# Patient Record
Sex: Female | Born: 1980 | Race: Black or African American | Hispanic: No | Marital: Single | Smoking: Current every day smoker
Health system: Southern US, Community
[De-identification: ages and names within clinical notes are randomized; demographics above are authoritative.]

---

## 2021-06-14 ENCOUNTER — Emergency Department (HOSPITAL_BASED_OUTPATIENT_CLINIC_OR_DEPARTMENT_OTHER)
Admission: EM | Admit: 2021-06-14 | Discharge: 2021-06-14 | Disposition: A | Discharge status: Home / Self Care | Payer: Medicaid Other | Attending: Emergency Medicine | Admitting: Emergency Medicine

## 2021-06-14 ENCOUNTER — Other Ambulatory Visit: Payer: Self-pay

## 2021-06-14 ENCOUNTER — Encounter (HOSPITAL_BASED_OUTPATIENT_CLINIC_OR_DEPARTMENT_OTHER): Payer: Self-pay | Admitting: Urology

## 2021-06-14 ENCOUNTER — Emergency Department (HOSPITAL_BASED_OUTPATIENT_CLINIC_OR_DEPARTMENT_OTHER): Payer: Medicaid Other

## 2021-06-14 DIAGNOSIS — R1084 Generalized abdominal pain: Secondary | ICD-10-CM | POA: Diagnosis present

## 2021-06-14 DIAGNOSIS — F1010 Alcohol abuse, uncomplicated: Secondary | ICD-10-CM | POA: Diagnosis not present

## 2021-06-14 DIAGNOSIS — R1031 Right lower quadrant pain: Secondary | ICD-10-CM | POA: Insufficient documentation

## 2021-06-14 DIAGNOSIS — D72829 Elevated white blood cell count, unspecified: Secondary | ICD-10-CM | POA: Insufficient documentation

## 2021-06-14 DIAGNOSIS — R1033 Periumbilical pain: Secondary | ICD-10-CM | POA: Insufficient documentation

## 2021-06-14 LAB — URINALYSIS, ROUTINE W REFLEX MICROSCOPIC
Bilirubin Urine: NEGATIVE
Glucose, UA: NEGATIVE mg/dL
Ketones, ur: NEGATIVE mg/dL
Leukocytes,Ua: NEGATIVE
Nitrite: NEGATIVE
Protein, ur: NEGATIVE mg/dL
Specific Gravity, Urine: 1.025 (ref 1.005–1.030)
pH: 6.5 (ref 5.0–8.0)

## 2021-06-14 LAB — COMPREHENSIVE METABOLIC PANEL
ALT: 21 U/L (ref 0–44)
AST: 18 U/L (ref 15–41)
Albumin: 3.8 g/dL (ref 3.5–5.0)
Alkaline Phosphatase: 67 U/L (ref 38–126)
Anion gap: 6 (ref 5–15)
BUN: 14 mg/dL (ref 6–20)
CO2: 23 mmol/L (ref 22–32)
Calcium: 9.2 mg/dL (ref 8.9–10.3)
Chloride: 109 mmol/L (ref 98–111)
Creatinine, Ser: 0.93 mg/dL (ref 0.44–1.00)
GFR, Estimated: >60 mL/min (ref >60)
Glucose, Bld: 99 mg/dL (ref 70–99)
Potassium: 4.1 mmol/L (ref 3.5–5.1)
Sodium: 138 mmol/L (ref 135–145)
Total Bilirubin: 0.4 mg/dL (ref 0.3–1.2)
Total Protein: 7 g/dL (ref 6.5–8.1)

## 2021-06-14 LAB — CBC
HCT: 38.7 % (ref 36.0–46.0)
Hemoglobin: 12.8 g/dL (ref 12.0–15.0)
MCH: 28.2 pg (ref 26.0–34.0)
MCHC: 33.1 g/dL (ref 30.0–36.0)
MCV: 85.2 fL (ref 80.0–100.0)
Platelets: 332 10*3/uL (ref 150–400)
RBC: 4.54 MIL/uL (ref 3.87–5.11)
RDW: 13.7 % (ref 11.5–15.5)
WBC: 11 10*3/uL — ABNORMAL HIGH (ref 4.0–10.5)
nRBC: 0 % (ref 0.0–0.2)

## 2021-06-14 LAB — URINALYSIS, MICROSCOPIC (REFLEX)

## 2021-06-14 LAB — LIPASE, BLOOD: Lipase: 39 U/L (ref 11–51)

## 2021-06-14 LAB — PREGNANCY, URINE: Preg Test, Ur: NEGATIVE

## 2021-06-14 MED ORDER — ONDANSETRON HCL 4 MG/2ML IJ SOLN
4.0000 mg | Freq: Once | INTRAMUSCULAR | Status: AC
  Administered 2021-06-14: 4 mg via INTRAVENOUS
  Filled 2021-06-14: qty 2

## 2021-06-14 MED ORDER — IOHEXOL 300 MG/ML  SOLN
100.0000 mL | Freq: Once | INTRAMUSCULAR | Status: AC | PRN
  Administered 2021-06-14: 100 mL via INTRAVENOUS

## 2021-06-14 MED ORDER — SODIUM CHLORIDE 0.9 % IV BOLUS
1000.0000 mL | Freq: Once | INTRAVENOUS | Status: AC
  Administered 2021-06-14: 1000 mL via INTRAVENOUS

## 2021-06-14 MED ORDER — MORPHINE SULFATE (PF) 4 MG/ML IV SOLN
4.0000 mg | Freq: Once | INTRAVENOUS | Status: AC
  Administered 2021-06-14: 4 mg via INTRAVENOUS
  Filled 2021-06-14: qty 1

## 2021-06-14 MED ORDER — NAPROXEN 500 MG PO TABS: 500.0000 mg | ORAL_TABLET | Freq: Two times a day (BID) | ORAL | 0 refills | Status: AC

## 2021-06-14 NOTE — ED Provider Notes (Signed)
?[facility] EMERGENCY DEPARTMENT ?Provider Note ? ? ?CSN: [id number] ?Arrival date & time: 06/14/21  1746 ? ?  ? ?History ?Chief Complaint  ?Patient presents with  ? Abdominal Pain  ? ? ?Felicia Hood is a 41 y.o. female. ? ?41 year old female with a past medical history of hyperlipidemia presents to the ED with a chief complaint of sudden onset of generalized abdominal pain that began 6 hours prior to arrival.  Patient described as a tightening sensation to the periumbilical area which tends then dissipate but does not radiate anywhere.  She does have a prior history of episodes like these in the past, however never seek medical attention.  There is some alleviating factor when she pushes on her abdomen, no exacerbating factors.  Her last bowel movement was this morning and there was no blood present.  She does endorse alcohol use, however very casually.  He did not take any medication for improvement in her symptoms.  She denies any fever, sick contacts, urinary symptoms, prior surgical intervention of her abdomen. ? ? ?The history is provided by the patient and medical records.  ?Abdominal Pain ?Pain location:  Periumbilical ?Pain quality: pressure and squeezing   ?Pain radiates to:  Does not radiate ?Pain severity:  Moderate ?Onset quality:  Sudden ?Duration:  6 hours ?Timing:  Constant ?Progression:  Worsening ?Chronicity:  New ?Context: not diet changes, not laxative use, not previous surgeries, not recent illness, not sick contacts and not suspicious food intake   ?Relieved by:  Palpation ?Worsened by:  Nothing ?Ineffective treatments:  None tried ?Associated symptoms: no chills, no fever, no hematuria, no shortness of breath and no vaginal discharge   ?Risk factors: alcohol abuse   ?Risk factors: has not had multiple surgeries   ? ?  ? ?Home Medications ?Prior to Admission medications   ?Medication Sig Start Date End Date Taking? Authorizing Provider  ?naproxen (NAPROSYN) 500 MG tablet Take 1  tablet (500 mg total) by mouth 2 (two) times daily with a meal for 7 days. 06/14/21 06/21/21 Yes Claude Manges, PA-C  ?   ? ?Allergies    ?Penicillins   ? ?Review of Systems   ?Review of Systems  ?Constitutional:  Negative for chills and fever.  ?Respiratory:  Negative for shortness of breath.   ?Gastrointestinal:  Positive for abdominal pain.  ?Genitourinary:  Negative for hematuria and vaginal discharge.  ?All other systems reviewed and are negative. ? ?Physical Exam ?Updated Vital Signs ?BP 136/77   Pulse 86   Temp 97.8 ?F (36.6 ?C) (Oral)   Resp 20   Ht 5\' 6"  (1.676 m)   Wt 104.3 kg   SpO2 99%   BMI 37.12 kg/m?  ?Physical Exam ?Vitals and nursing note reviewed.  ?Constitutional:   ?   Appearance: She is well-developed.  ?Cardiovascular:  ?   Rate and Rhythm: Normal rate.  ?Pulmonary:  ?   Effort: Pulmonary effort is normal.  ?   Breath sounds: No wheezing or rales.  ?Abdominal:  ?   General: Abdomen is flat. Bowel sounds are decreased.  ?   Palpations: Abdomen is soft.  ?   Tenderness: There is abdominal tenderness in the periumbilical area. There is no right CVA tenderness or left CVA tenderness. Positive signs include McBurney's sign.  ?   Comments: Bowel sounds are diminished.  Generalized pain with palpation on exam somewhat alleviated with gentle pressure around the periumbilical area, worsening pain around the right lower quadrant.  ?Skin: ?   General:  Skin is warm and dry.  ?Neurological:  ?   Mental Status: She is alert and oriented to person, place, and time.  ? ? ?ED Results / Procedures / Treatments   ?Labs ?(all labs ordered are listed, but only abnormal results are displayed) ?Labs Reviewed  ?CBC - Abnormal; Notable for the following components:  ?    Result Value  ? WBC 11.0 (*)   ? All other components within normal limits  ?URINALYSIS, ROUTINE W REFLEX MICROSCOPIC - Abnormal; Notable for the following components:  ? Hgb urine dipstick TRACE (*)   ? All other components within normal limits   ?URINALYSIS, MICROSCOPIC (REFLEX) - Abnormal; Notable for the following components:  ? Bacteria, UA MANY (*)   ? All other components within normal limits  ?LIPASE, BLOOD  ?COMPREHENSIVE METABOLIC PANEL  ?PREGNANCY, URINE  ? ? ?EKG ?None ? ?Radiology ?CT ABDOMEN PELVIS W CONTRAST ? ?Result Date: 06/14/2021 ?CLINICAL DATA:  Generalized abdominal pain. EXAM: CT ABDOMEN AND PELVIS WITH CONTRAST TECHNIQUE: Multidetector CT imaging of the abdomen and pelvis was performed using the standard protocol following bolus administration of intravenous contrast. RADIATION DOSE REDUCTION: This exam was performed according to the departmental dose-optimization program which includes automated exposure control, adjustment of the mA and/or kV according to patient size and/or use of iterative reconstruction technique. CONTRAST:  OMNIPAQUE IOHEXOL 300 MG/ML  SOLN COMPARISON:  November 22, 2018 FINDINGS: Lower chest: No acute abnormality. Hepatobiliary: No focal liver abnormality is seen. No gallstones, gallbladder wall thickening, or biliary dilatation. Pancreas: Unremarkable. No pancreatic ductal dilatation or surrounding inflammatory changes. Spleen: Normal in size without focal abnormality. Adrenals/Urinary Tract: Adrenal glands are unremarkable. Kidneys are normal in size, without renal calculi or hydronephrosis. 14 mm x 9 mm and 16 mm x 13 mm cysts are seen within the anterior aspect of the mid left kidney. No additional follow-up or imaging is recommended. Bladder is unremarkable. Stomach/Bowel: Stomach is within normal limits. Appendix appears normal. No evidence of bowel wall thickening, distention, or inflammatory changes. Vascular/Lymphatic: No significant vascular findings are present. No enlarged abdominal or pelvic lymph nodes. Reproductive: Uterus is unremarkable. A 4.0 cm x 3.4 cm simple cyst is seen along the posterior aspect of the left adnexa. Other: No abdominal wall hernia or abnormality. No abdominopelvic  ascites. Musculoskeletal: No acute or significant osseous findings. IMPRESSION: 4.0 cm x 3.4 cm simple cyst along the posterior aspect of the left adnexa, likely ovarian in origin. No follow-up imaging is recommended. Reference: JACR 2020 Feb;17(2):248-254 Electronically Signed   By: Aram Candela M.D.   On: 06/14/2021 19:36   ? ?Procedures ?Procedures  ? ? ?Medications Ordered in ED ?Medications  ?morphine (PF) 4 MG/ML injection 4 mg (4 mg Intravenous Given 06/14/21 1824)  ?ondansetron [facility]) injection 4 mg (4 mg Intravenous Given 06/14/21 1823)  ?sodium chloride 0.9 % bolus 1,000 mL ( Intravenous Stopped 06/14/21 1822)  ?iohexol (OMNIPAQUE) 300 MG/ML solution 100 mL (100 mLs Intravenous Contrast Given 06/14/21 1910)  ?morphine (PF) 4 MG/ML injection 4 mg (4 mg Intravenous Given 06/14/21 1947)  ? ? ?ED Course/ Medical Decision Making/ A&P ?  ?                        ?Medical Decision Making ?Amount and/or Complexity of Data Reviewed ?Labs: ordered. ?Radiology: ordered. ? ?Risk ?Prescription drug management. ? ? ? ?This patient presents to the ED for concern of abdominal pain, this involves a number of treatment options,  and is a complaint that carries with it a high risk of complications and morbidity.  The differential diagnosis includes appendicitis, perforation, mesenteric ischemia.  ? ? ?Co morbidities: ?Discussed in HPI ? ? ?Brief History: ? ?Patient with no pertinent past medical history presents to the ED with a chief complaint of sudden onset of abdominal pain approximately 6 hours prior to arrival.  Alleviating with palpation, no exacerbating factors.  Did not take any medication for improvement in symptoms.  No sick contacts, no nausea, no vomiting, no diarrhea.  Last bowel movement approximately this morning without any blood.  No gynecological complaints, no urinary complaints. ? ?EMR reviewed including pt PMHx, past surgical history and past visits to ER.  ? ?See HPI for more details ? ? ?Lab  Tests: ? ?I ordered and independently interpreted labs.  The pertinent results include:   ? ?I personally reviewed all laboratory work and imaging. Metabolic panel without any acute abnormality specifically kidney function

## 2021-06-14 NOTE — Discharge Instructions (Addendum)
Your laboratory results are within normal limits today.  We discussed the CT findings on today's visit, a copy of your report was attached to your discharge papers. ? ?I have also prescribed a short course of anti-inflammatories to help with any pain, please take 1 tablet twice a day with food for the next 7 days. ? ?Please schedule an appointment gastroenterology for further evaluation of your abdominal pain. ?

## 2021-06-14 NOTE — ED Triage Notes (Signed)
Generalized abdominal pain that started this am, denies N/V/D  ?Denies fever  ?

## 2021-06-29 ENCOUNTER — Ambulatory Visit: Payer: Medicaid Other | Admitting: Family Medicine

## 2021-12-04 ENCOUNTER — Encounter (HOSPITAL_BASED_OUTPATIENT_CLINIC_OR_DEPARTMENT_OTHER): Payer: Self-pay | Admitting: Emergency Medicine

## 2021-12-04 ENCOUNTER — Other Ambulatory Visit: Payer: Self-pay

## 2021-12-04 ENCOUNTER — Emergency Department (HOSPITAL_BASED_OUTPATIENT_CLINIC_OR_DEPARTMENT_OTHER)
Admission: EM | Admit: 2021-12-04 | Discharge: 2021-12-04 | Discharge status: Against Medical Advice | Payer: Medicaid Other | Attending: Emergency Medicine | Admitting: Emergency Medicine

## 2021-12-04 DIAGNOSIS — M5441 Lumbago with sciatica, right side: Secondary | ICD-10-CM | POA: Insufficient documentation

## 2021-12-04 DIAGNOSIS — Z5321 Procedure and treatment not carried out due to patient leaving prior to being seen by health care provider: Secondary | ICD-10-CM | POA: Insufficient documentation

## 2021-12-04 NOTE — ED Triage Notes (Signed)
Pt c/o right sided back pain associated with sciatica. Hx of same. States feels the same.

## 2023-09-29 IMAGING — CT CT ABD-PELV W/ CM
2 of 5 series · 15 of 46 positions shown, 17 images · IV contrast (Omnipaque)
Comparison: November 22, 2018

CLINICAL DATA: Generalized abdominal pain.

EXAM:
CT ABDOMEN AND PELVIS WITH CONTRAST
TECHNIQUE: Multidetector CT imaging of the abdomen and pelvis was performed
using the standard protocol following bolus administration of
intravenous contrast.

[Series 2: axial st · axial · 0.98mm/px · z∈[+670,+1040]mm · 12 of 84 slices shown, 14 images]
[im 5/84  soft-tissue]
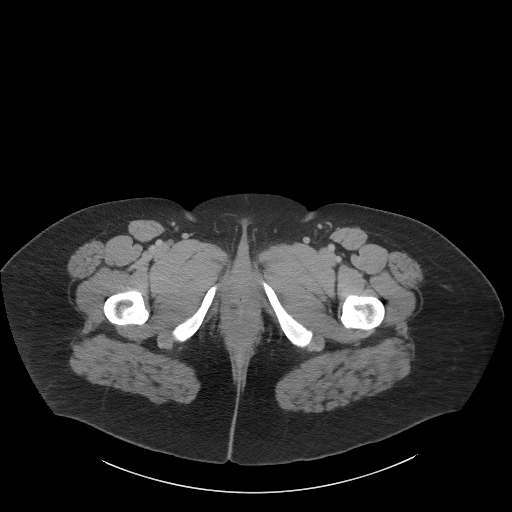
[im 5/84  bone]
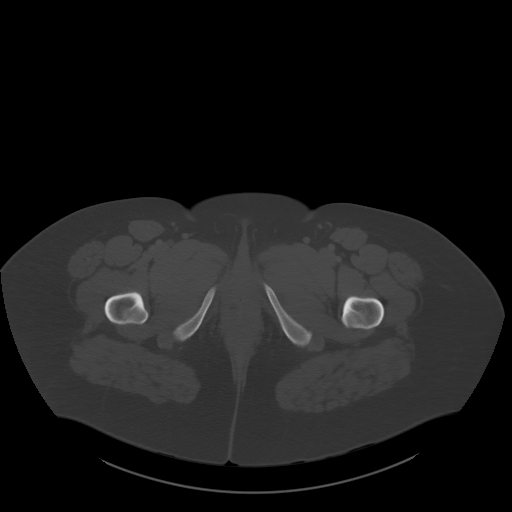
[im 14/84  soft-tissue]
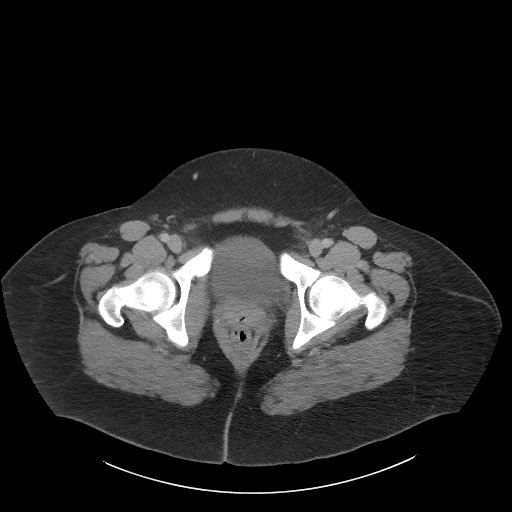
[im 18/84  soft-tissue]
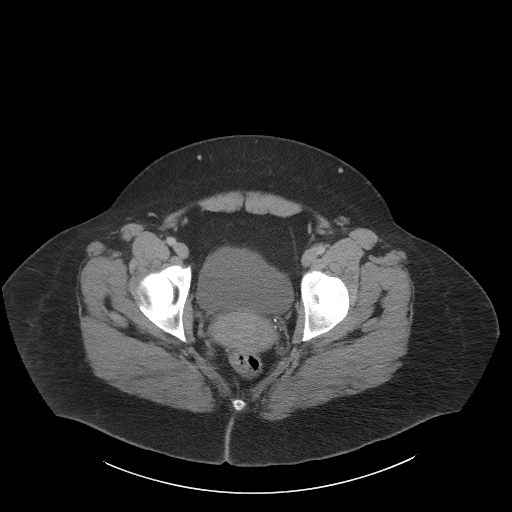
[im 27/84  soft-tissue]
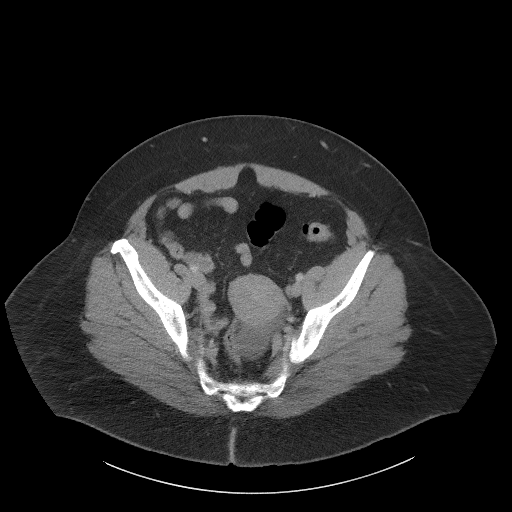
[im 31/84  soft-tissue]
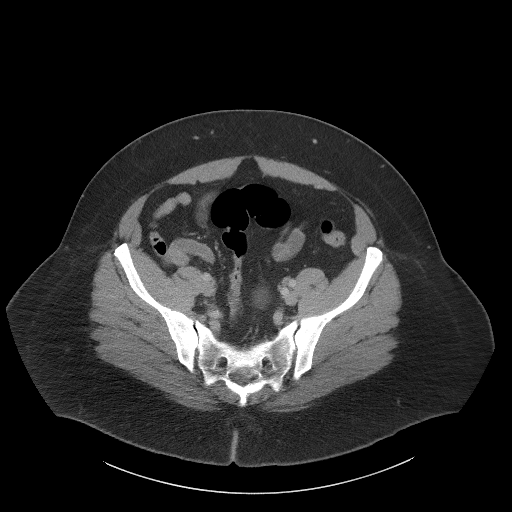
[im 40/84  soft-tissue]
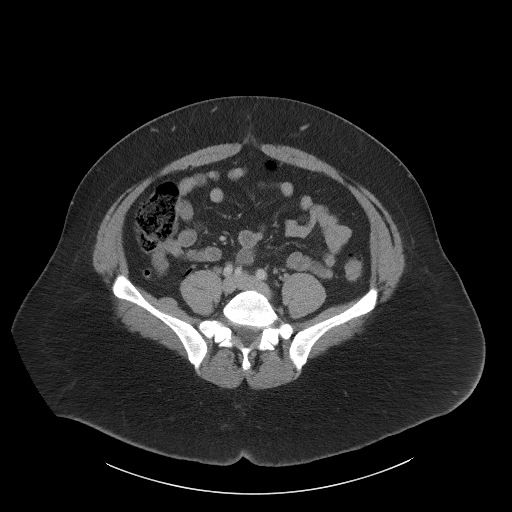
[im 44/84  soft-tissue]
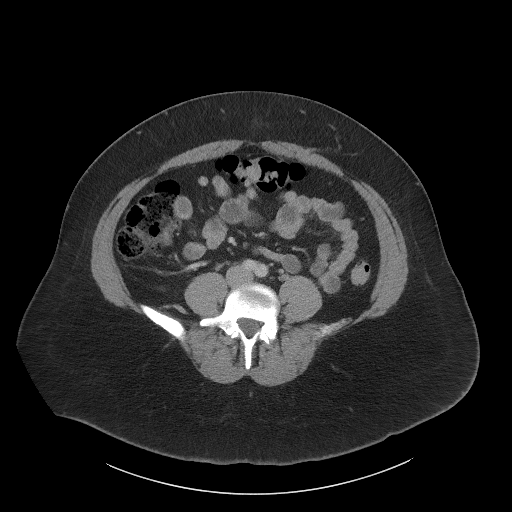
[im 53/84  soft-tissue]
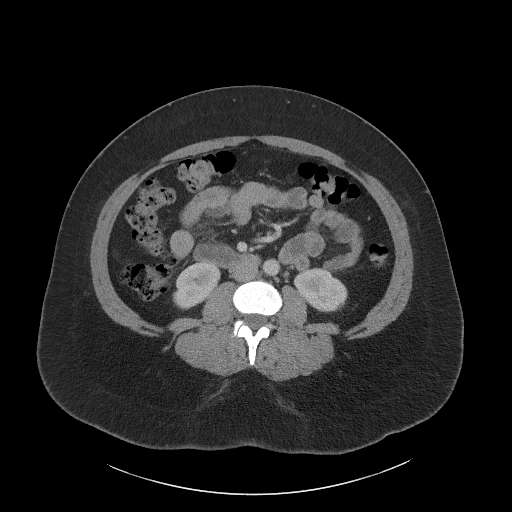
[im 57/84  soft-tissue]
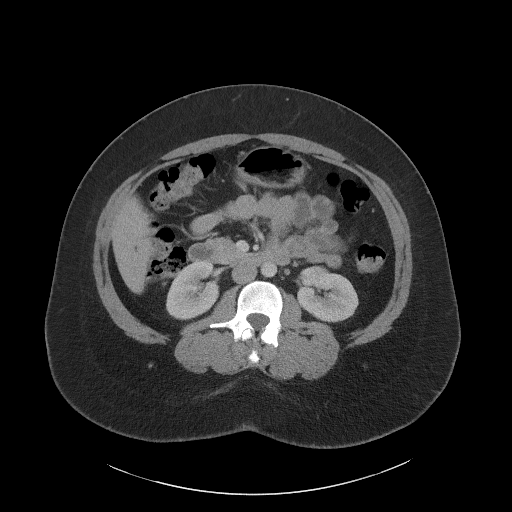
[im 57/84  bone]
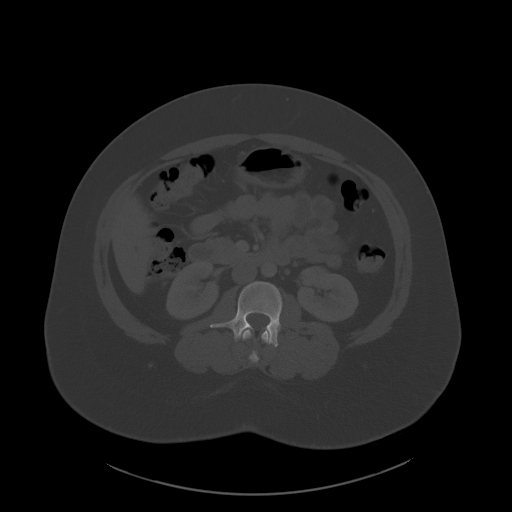
[im 66/84  soft-tissue]
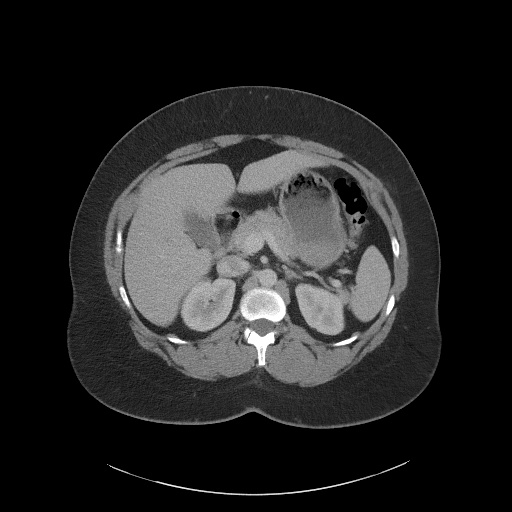
[im 70/84  soft-tissue]
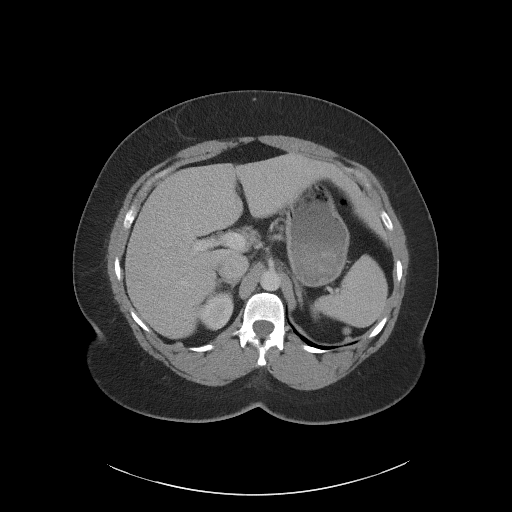
[im 79/84  soft-tissue]
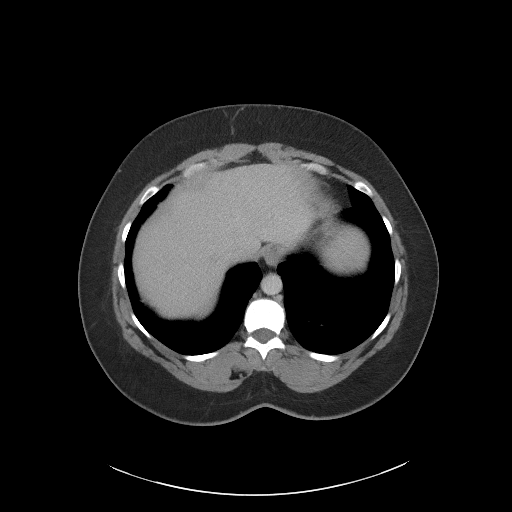

[Series 5: coronal st · coronal · 0.70mm/px · 3 of 106 slices shown]
[im 36/106  soft-tissue]
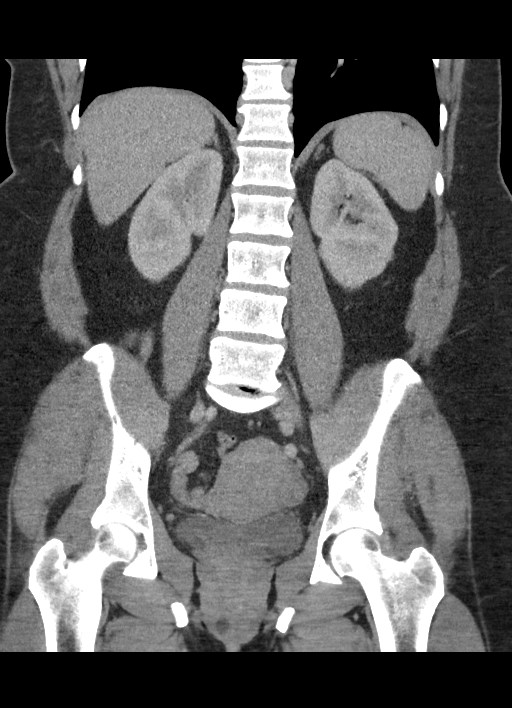
[im 47/106  soft-tissue]
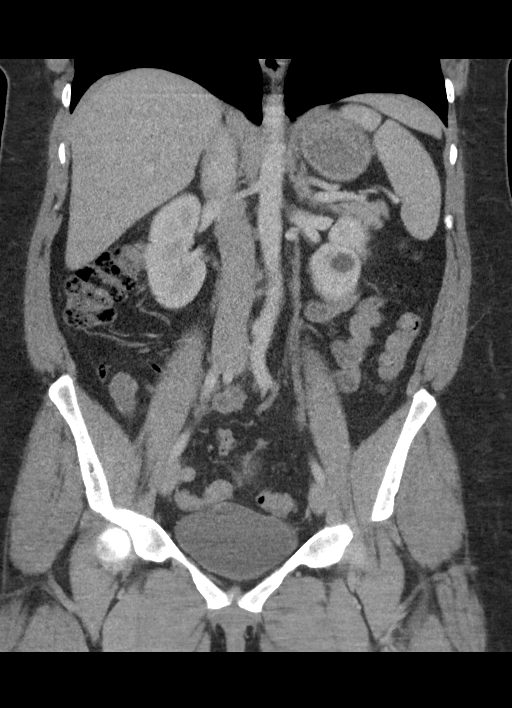
[im 59/106  soft-tissue]
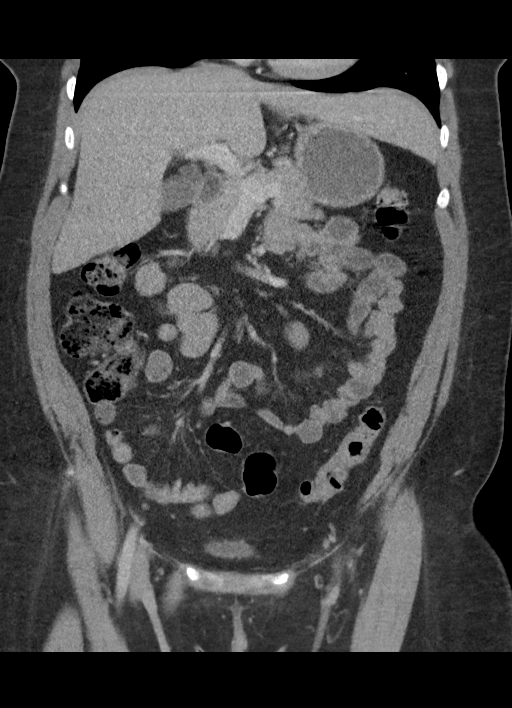

[15 of 46 positions shown; findings below may reference images not displayed]

RADIATION DOSE REDUCTION: This exam was performed according to the
departmental dose-optimization program which includes automated
exposure control, adjustment of the mA and/or kV according to
patient size and/or use of iterative reconstruction technique.

CONTRAST:  100mL OMNIPAQUE IOHEXOL 300 MG/ML  SOLN
FINDINGS: Lower chest: No acute abnormality.

Hepatobiliary: No focal liver abnormality is seen. No gallstones,
gallbladder wall thickening, or biliary dilatation.

Pancreas: Unremarkable. No pancreatic ductal dilatation or
surrounding inflammatory changes.

Spleen: Normal in size without focal abnormality.

Adrenals/Urinary Tract: Adrenal glands are unremarkable. Kidneys are
normal in size, without renal calculi or hydronephrosis. 14 mm x 9
mm and 16 mm x 13 mm cysts are seen within the anterior aspect of
the mid left kidney. No additional follow-up or imaging is
recommended. Bladder is unremarkable.

Stomach/Bowel: Stomach is within normal limits. Appendix appears
normal. No evidence of bowel wall thickening, distention, or
inflammatory changes.

Vascular/Lymphatic: No significant vascular findings are present. No
enlarged abdominal or pelvic lymph nodes.

Reproductive: Uterus is unremarkable. A 4.0 cm x 3.4 cm simple cyst
is seen along the posterior aspect of the left adnexa.

Other: No abdominal wall hernia or abnormality. No abdominopelvic
ascites.

Musculoskeletal: No acute or significant osseous findings.
IMPRESSION: 4.0 cm x 3.4 cm simple cyst along the posterior aspect of the left
adnexa, likely ovarian in origin. No follow-up imaging is
recommended. Reference: JACR [DATE]):[id number]
